# Patient Record
Sex: Female | Born: 1965 | Race: Black or African American | Hispanic: No | Smoking: Never smoker
Health system: Southern US, Community
[De-identification: ages and names within clinical notes are randomized; demographics above are authoritative.]

---

## 2012-07-24 ENCOUNTER — Observation Stay: Payer: Self-pay | Admitting: Obstetrics & Gynecology

## 2012-07-24 LAB — PREGNANCY, URINE: Pregnancy Test, Urine: NEGATIVE m[IU]/mL

## 2017-07-12 ENCOUNTER — Emergency Department: Payer: Medicare Other

## 2017-07-12 ENCOUNTER — Emergency Department
Admission: EM | Admit: 2017-07-12 | Discharge: 2017-07-12 | Disposition: A | Discharge status: Home / Self Care | Payer: Medicare Other | Attending: Emergency Medicine | Admitting: Emergency Medicine

## 2017-07-12 DIAGNOSIS — R1010 Upper abdominal pain, unspecified: Secondary | ICD-10-CM

## 2017-07-12 DIAGNOSIS — K859 Acute pancreatitis without necrosis or infection, unspecified: Secondary | ICD-10-CM

## 2017-07-12 DIAGNOSIS — R1011 Right upper quadrant pain: Secondary | ICD-10-CM | POA: Diagnosis present

## 2017-07-12 LAB — CBC
HCT: 42.3 % (ref 35.0–47.0)
Hemoglobin: 13.8 g/dL (ref 12.0–16.0)
MCH: 26.3 pg (ref 26.0–34.0)
MCHC: 32.6 g/dL (ref 32.0–36.0)
MCV: 80.6 fL (ref 80.0–100.0)
PLATELETS: 312 10*3/uL (ref 150–440)
RBC: 5.24 MIL/uL — ABNORMAL HIGH (ref 3.80–5.20)
RDW: 15.9 % — AB (ref 11.5–14.5)
WBC: 11.7 10*3/uL — ABNORMAL HIGH (ref 3.6–11.0)

## 2017-07-12 LAB — URINALYSIS, COMPLETE (UACMP) WITH MICROSCOPIC
Bacteria, UA: NONE SEEN
Bilirubin Urine: NEGATIVE
Glucose, UA: NEGATIVE mg/dL
Hgb urine dipstick: NEGATIVE
KETONES UR: NEGATIVE mg/dL
Nitrite: NEGATIVE
PH: 6 (ref 5.0–8.0)
Protein, ur: 30 mg/dL — AB
SPECIFIC GRAVITY, URINE: 1.018 (ref 1.005–1.030)

## 2017-07-12 LAB — COMPREHENSIVE METABOLIC PANEL
ALT: 25 U/L (ref 14–54)
AST: 23 U/L (ref 15–41)
Albumin: 4.4 g/dL (ref 3.5–5.0)
Alkaline Phosphatase: 90 U/L (ref 38–126)
Anion gap: 13 (ref 5–15)
BUN: 6 mg/dL (ref 6–20)
CHLORIDE: 99 mmol/L — AB (ref 101–111)
CO2: 27 mmol/L (ref 22–32)
CREATININE: 0.73 mg/dL (ref 0.44–1.00)
Calcium: 9.6 mg/dL (ref 8.9–10.3)
GFR calc Af Amer: >60 mL/min (ref >60)
GFR calc non Af Amer: >60 mL/min (ref >60)
Glucose, Bld: 115 mg/dL — ABNORMAL HIGH (ref 65–99)
Potassium: 3.8 mmol/L (ref 3.5–5.1)
SODIUM: 139 mmol/L (ref 135–145)
Total Bilirubin: 1.2 mg/dL (ref 0.3–1.2)
Total Protein: 9.5 g/dL — ABNORMAL HIGH (ref 6.5–8.1)

## 2017-07-12 LAB — LIPASE, BLOOD: LIPASE: 671 U/L — AB (ref 11–51)

## 2017-07-12 MED ORDER — ONDANSETRON 4 MG PO TBDP: 4.0000 mg | ORAL_TABLET | Freq: Three times a day (TID) | ORAL | 0 refills | Status: DC | PRN

## 2017-07-12 MED ORDER — HYDROCODONE-ACETAMINOPHEN 5-325 MG PO TABS: 1.0000 | ORAL_TABLET | Freq: Four times a day (QID) | ORAL | 0 refills | Status: AC | PRN

## 2017-07-12 NOTE — ED Triage Notes (Signed)
Pt came to ED via pov c/o generalized abdominal pain with nausea and vomiting. Temperature 99.0 in triage.

## 2017-07-12 NOTE — ED Notes (Signed)
ED Provider at bedside. 

## 2017-07-12 NOTE — ED Notes (Signed)
RUQ abdominal pain that started yesterday, pt states she tried apple cider vinegar and magnesium, states she vomited, states she is only able to eat very small amounts. Pt states she has never had this pain before. Ambulatory from lobby. Pt states the pain is 3/10. Describes the pain as intermittent and sharp.

## 2017-07-12 NOTE — ED Provider Notes (Signed)
[facility] Emergency Department Provider Note   ____________________________________________    I have reviewed the triage vital signs and the nursing notes.   HISTORY  Chief Complaint Abdominal Pain     HPI Carrie Sims is a 52 y.o. female who presents with complaints of abdominal pain which primarily occurred yesterday, today she reports she is feeling significantly better.  She reports the pain was in her epigastrium and was burning in nature.  She no longer has any pain.  She has never had this pain before.  She did not take anything for it.  She denies fevers or chills but did have nausea with the pain.  The pain did not radiate anywhere.  She does not drink alcohol, no new medications   History reviewed. No pertinent past medical history.  There are no active problems to display for this patient.   History reviewed. No pertinent surgical history.  Prior to Admission medications   Medication Sig Start Date End Date Taking? Authorizing Provider  HYDROcodone-acetaminophen (NORCO/VICODIN) 5-325 MG tablet Take 1 tablet by mouth every 6 (six) hours as needed for severe pain. 07/12/17   Jene Every, MD     Allergies Patient has no known allergies.  History reviewed. No pertinent family history.  Social History Social History   Tobacco Use  . Smoking status: Never Smoker  . Smokeless tobacco: Never Used  Substance Use Topics  . Alcohol use: No    Frequency: Never  . Drug use: Not on file    Review of Systems  Constitutional: No fever/chills Eyes: No visual changes.  ENT: No sore throat. Cardiovascular: Denies chest pain. Respiratory: Denies shortness of breath. Gastrointestinal: No abdominal pain.  No nausea, no vomiting.   Genitourinary: Negative for dysuria. Musculoskeletal: Negative for back pain. Skin: Negative for rash. Neurological: Negative for headaches or  weakness   ____________________________________________   PHYSICAL EXAM:  VITAL SIGNS: ED Triage Vitals  Enc Vitals Group     BP 07/12/17 1135 132/89     Pulse Rate 07/12/17 1135 100     Resp 07/12/17 1135 18     Temp 07/12/17 1135 99 F (37.2 C)     Temp Source 07/12/17 1135 Oral     SpO2 07/12/17 1135 94 %     Weight 07/12/17 1135 (!) 143.3 kg (316 lb)     Height 07/12/17 1135 1.651 m (5\' 5" )     Head Circumference --      Peak Flow --      Pain Score 07/12/17 1555 3     Pain Loc --      Pain Edu? --      Excl. in GC? --     Constitutional: Alert and oriented. No acute distress. Pleasant and interactive Eyes: Conjunctivae are normal.   Mouth/Throat: Mucous membranes are moist.   Neck:  Painless ROM Cardiovascular: Normal rate, regular rhythm. Grossly normal heart sounds.  Good peripheral circulation. Respiratory: Normal respiratory effort.  No retractions. Lungs CTAB. Gastrointestinal: Soft and nontender. No distention.  No CVA tenderness.  Musculoskeletal:  Warm and well perfused Neurologic:  Normal speech and language. No gross focal neurologic deficits are appreciated.  Skin:  Skin is warm, dry and intact. No rash noted. Psychiatric: Mood and affect are normal. Speech and behavior are normal.  ____________________________________________   LABS (all labs ordered are listed, but only abnormal results are displayed)  Labs Reviewed  LIPASE, BLOOD - Abnormal; Notable for the following components:  Result Value   Lipase 671 (*)    All other components within normal limits  COMPREHENSIVE METABOLIC PANEL - Abnormal; Notable for the following components:   Chloride 99 (*)    Glucose, Bld 115 (*)    Total Protein 9.5 (*)    All other components within normal limits  CBC - Abnormal; Notable for the following components:   WBC 11.7 (*)    RBC 5.24 (*)    RDW 15.9 (*)    All other components within normal limits  URINALYSIS, COMPLETE (UACMP) WITH MICROSCOPIC  - Abnormal; Notable for the following components:   Color, Urine YELLOW (*)    APPearance CLOUDY (*)    Protein, ur 30 (*)    Leukocytes, UA TRACE (*)    Squamous Epithelial / LPF 6-30 (*)    All other components within normal limits   ____________________________________________  EKG  None ____________________________________________  RADIOLOGY  Ultrasound of the right upper quadrant negative for gallstones ____________________________________________   PROCEDURES  Procedure(s) performed: No  Procedures   Critical Care performed: No ____________________________________________   INITIAL IMPRESSION / ASSESSMENT AND PLAN / ED COURSE  Pertinent labs & imaging results that were available during my care of the patient were reviewed by me and considered in my medical decision making (see chart for details).  Presents with epigastric pain that resolved.  Her lipase is elevated concerning for an episode of acute pancreatitis.  We obtained an ultrasound which did not show any evidence of gallstones.  Discussed lipase and pancreatitis with the patient, given that she is essentially pain-free do not feel that admission is warranted.  Recommend liquid diet, will prescribe pain medication, avoidance of fatty foods and strict return precautions    ____________________________________________   FINAL CLINICAL IMPRESSION(S) / ED DIAGNOSES  Final diagnoses:  Upper abdominal pain  Acute pancreatitis, unspecified complication status, unspecified pancreatitis type        Note:  This document was prepared using Dragon voice recognition software and may include unintentional dictation errors.    Jene EveryKinner, [place], MD 07/12/17 [phone]

## 2017-07-12 NOTE — ED Notes (Signed)
Patient transported to Ultrasound 

## 2018-12-01 IMAGING — US US ABDOMEN LIMITED
1 series · 14 of 25 positions shown · non-contrast
Comparison: None.

CLINICAL DATA: Upper abdominal pain

EXAM:
ULTRASOUND ABDOMEN LIMITED RIGHT UPPER QUADRANT

[Series 1: us abdomen limited · 0.27mm/px · 14 of 31 slices shown]
[im 1/31]
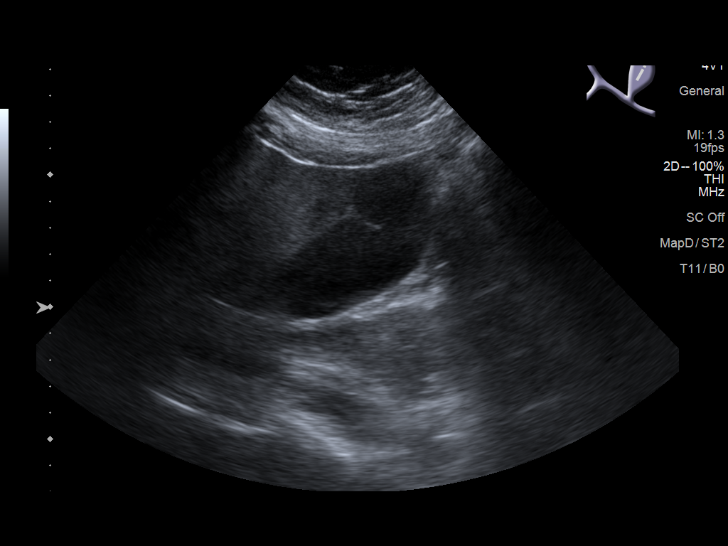
[im 3/31]
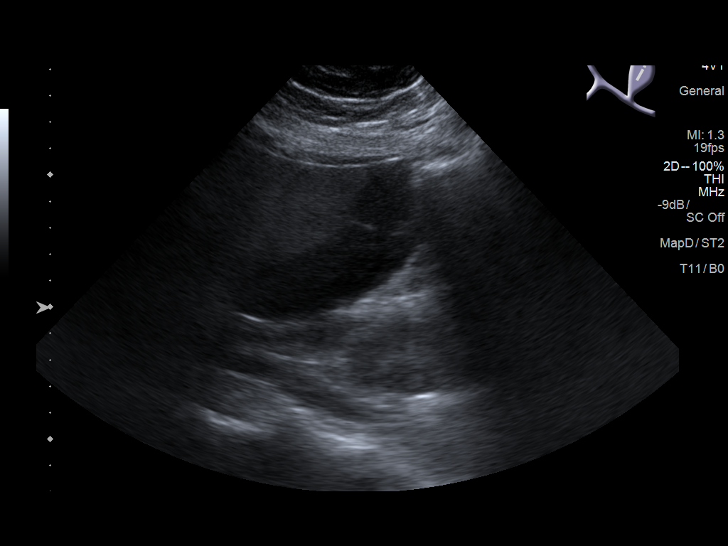
[im 6/31]
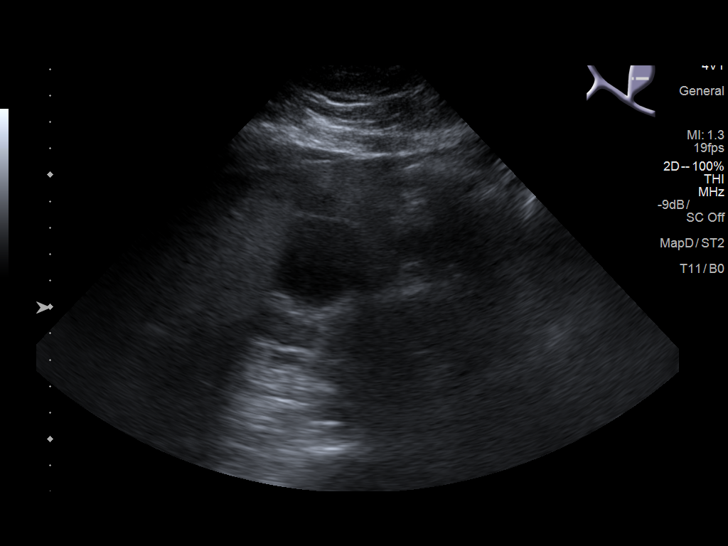
[im 8/31]
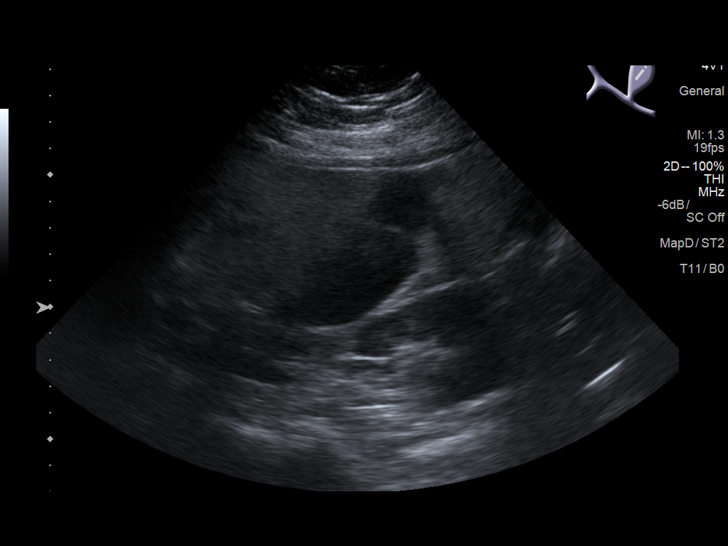
[im 11/31]
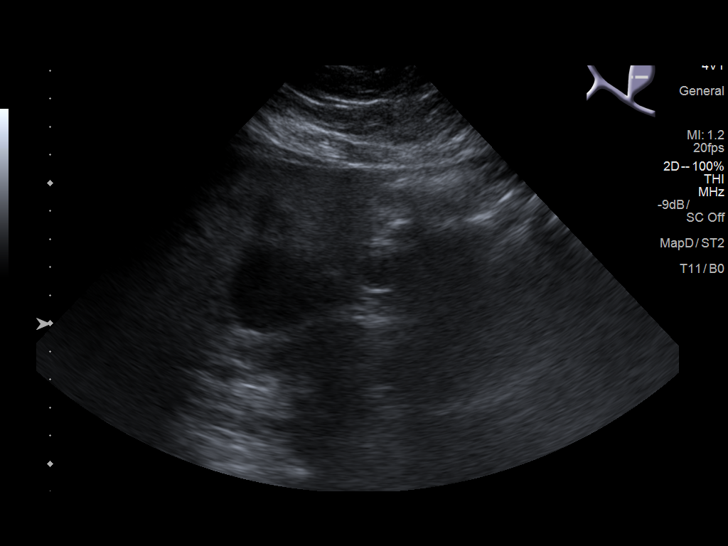
[im 12/31]
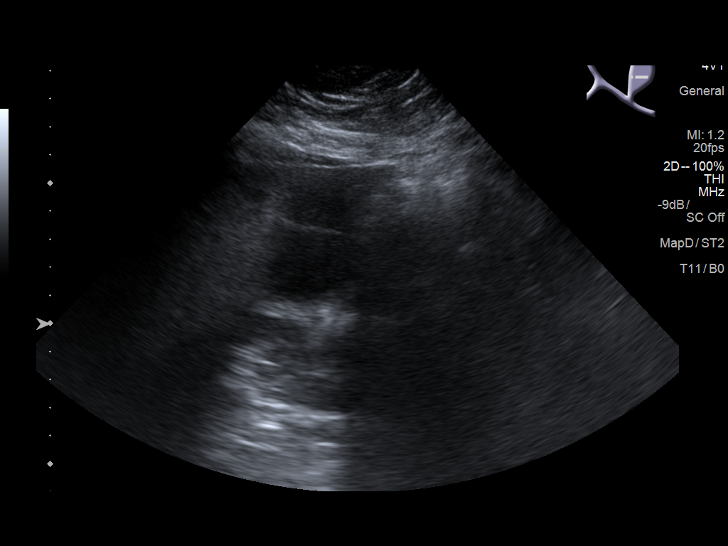
[im 14/31]
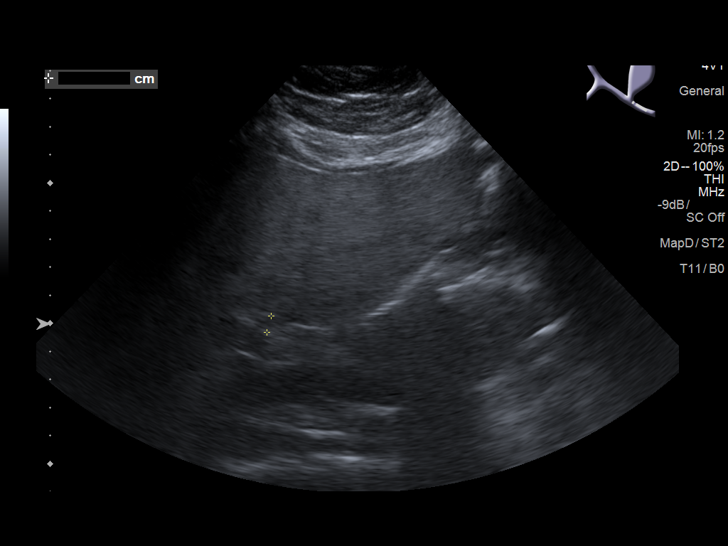
[im 17/31]
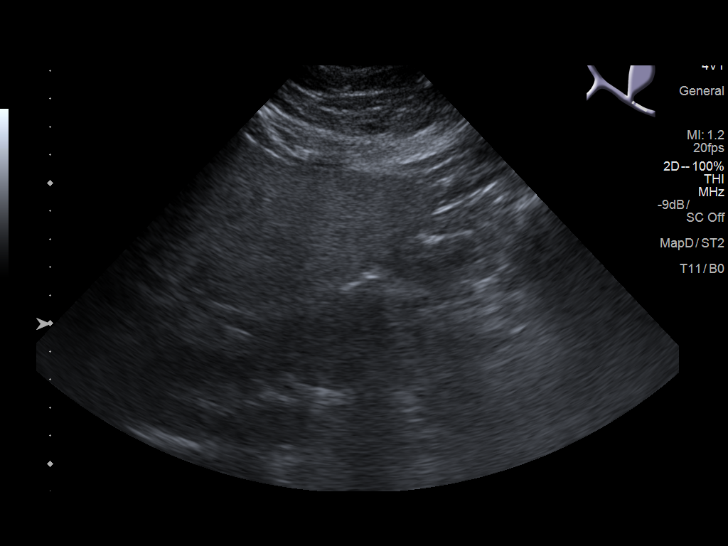
[im 19/31]
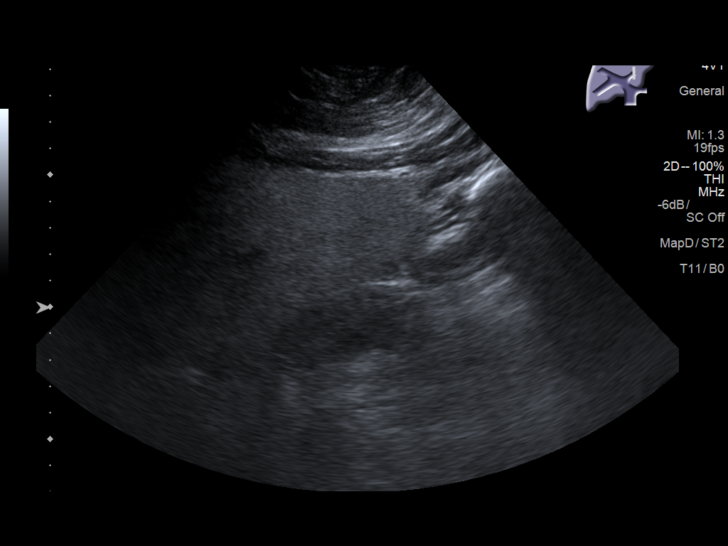
[im 21/31]
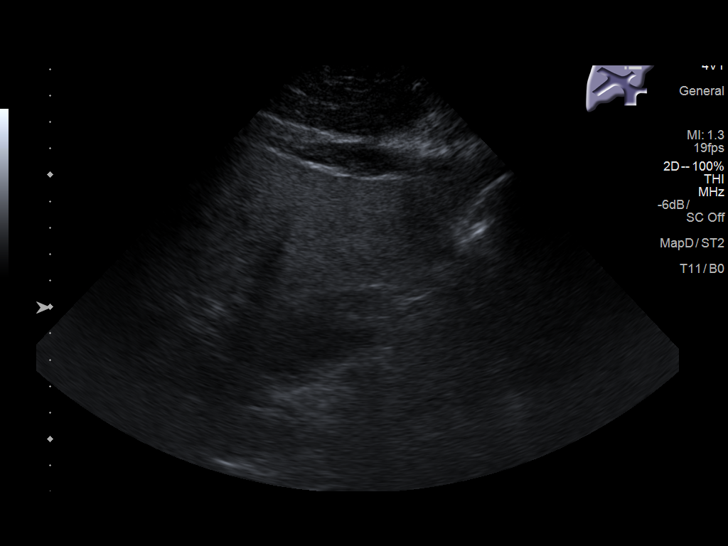
[im 23/31]
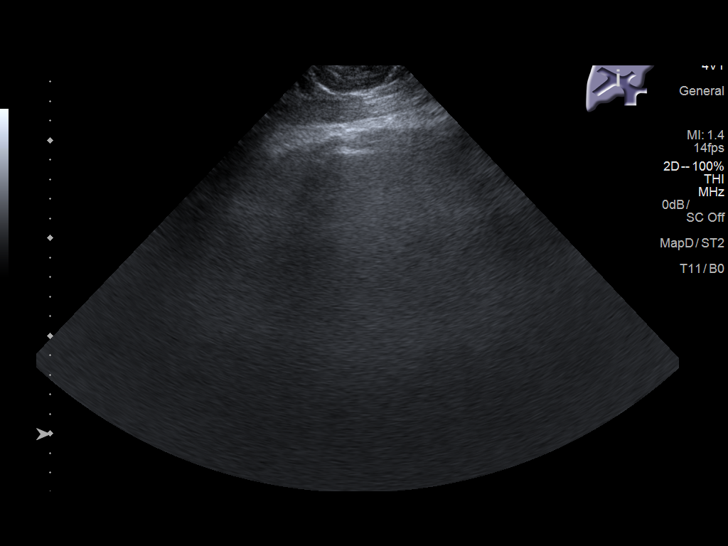
[im 26/31]
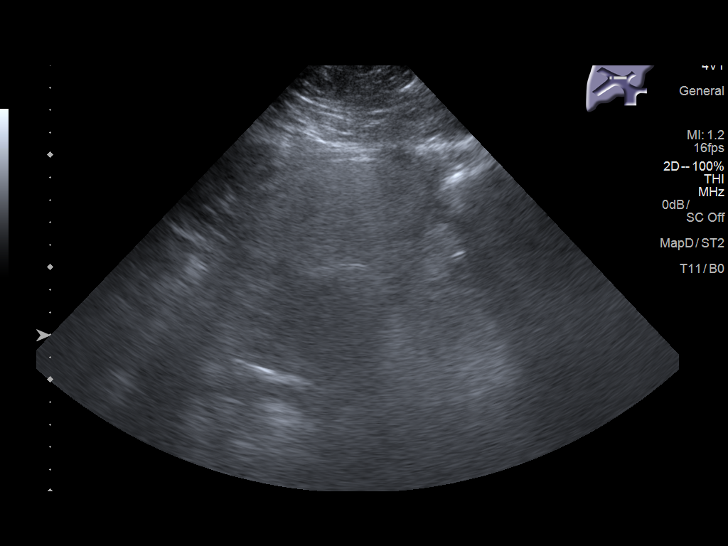
[im 28/31]
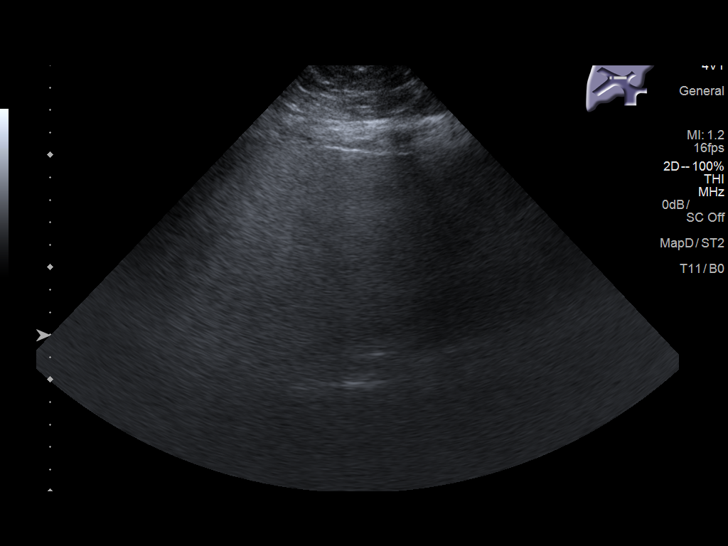
[im 31/31]
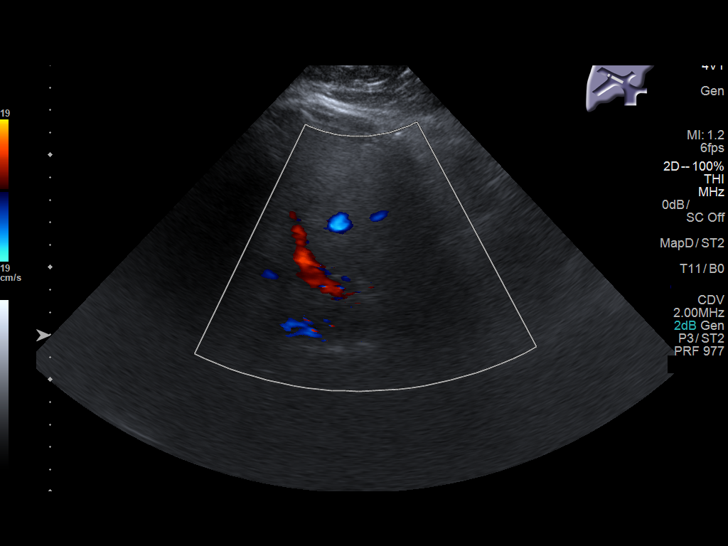

[14 of 25 positions shown; findings below may reference images not displayed]

FINDINGS: Gallbladder:

No gallstones or wall thickening visualized. No sonographic Murphy
sign noted by sonographer.

Common bile duct:

Diameter: 6 mm

Liver:

Diffusely echogenic liver, difficult to image. No focal liver
lesion. Portal vein is patent on color Doppler imaging with normal
direction of blood flow towards the liver.
IMPRESSION: Negative for gallstones.

Fatty infiltration of the liver.

## 2021-12-21 DIAGNOSIS — R6889 Other general symptoms and signs: Secondary | ICD-10-CM | POA: Diagnosis not present

## 2021-12-26 DIAGNOSIS — R6889 Other general symptoms and signs: Secondary | ICD-10-CM | POA: Diagnosis not present
